# Patient Record
Sex: Female | Born: 2000 | Race: White | Hispanic: No | Marital: Single | Smoking: Never smoker
Health system: Southern US, Community
[De-identification: ages and names within clinical notes are randomized; demographics above are authoritative.]

---
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
## 2021-10-04 IMAGING — US US ABDOMEN LIMITED
1 series · 14 of 25 positions shown · non-contrast
Comparison: None.

CLINICAL DATA: Elevated LFTs

EXAM:
ULTRASOUND ABDOMEN LIMITED RIGHT UPPER QUADRANT

[Series 1: us abdomen limited ruq (liver/gb) · 14 of 42 slices shown]
[im 1/42]
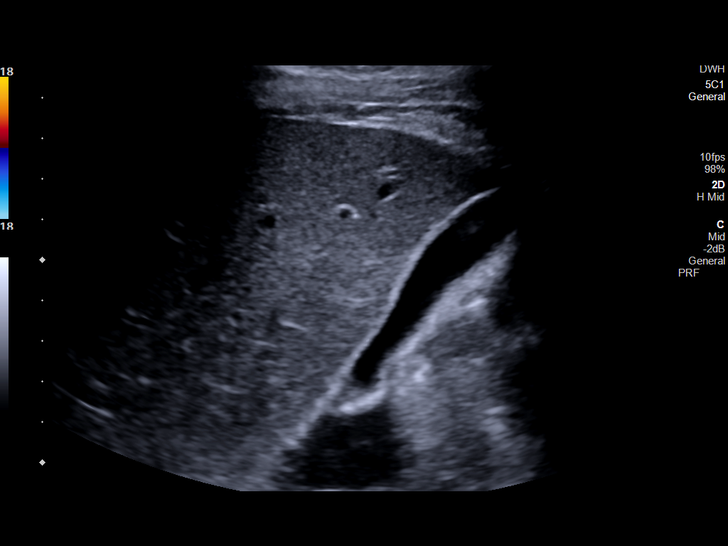
[im 4/42]
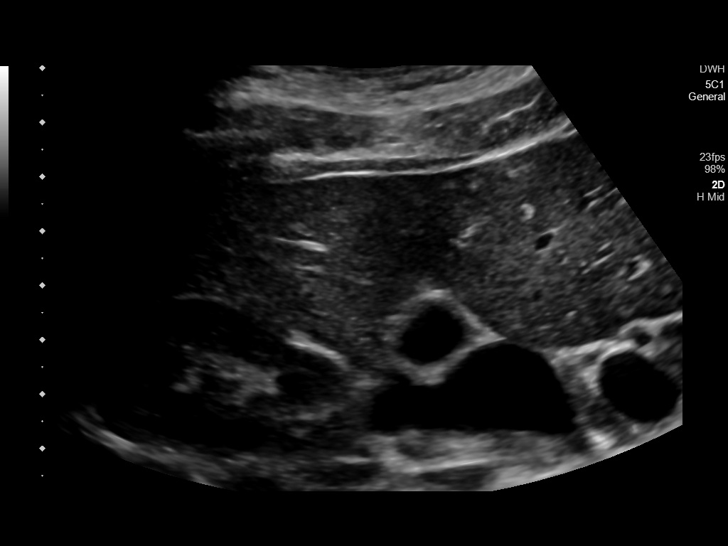
[im 7/42]
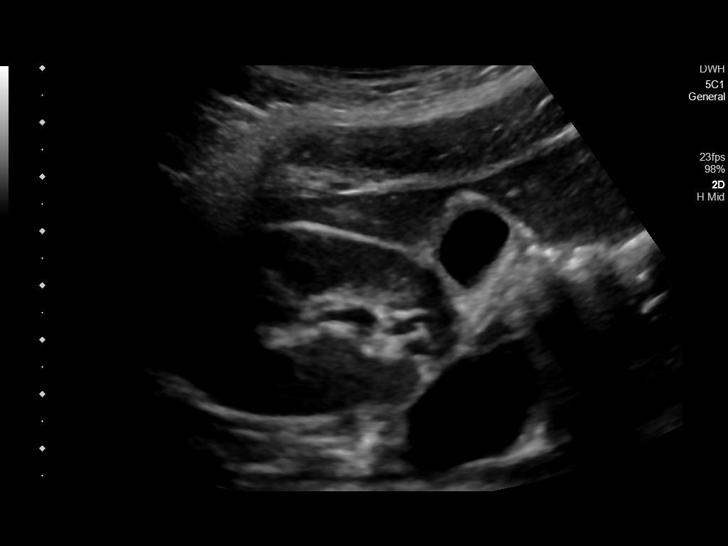
[im 11/42]
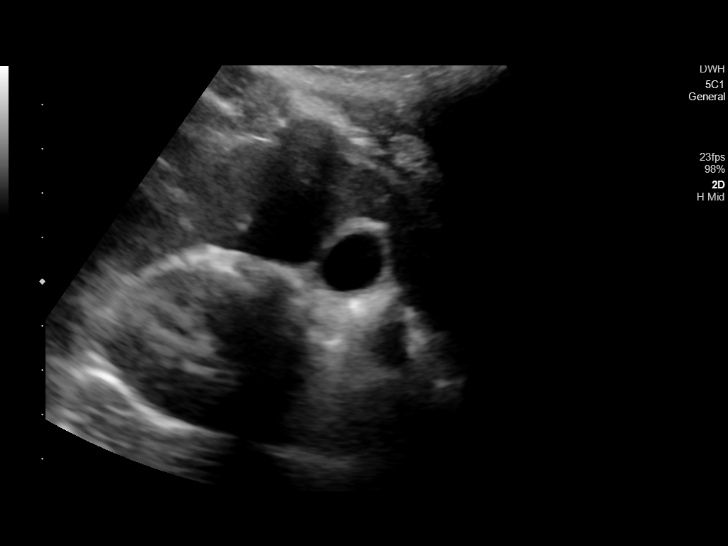
[im 14/42]
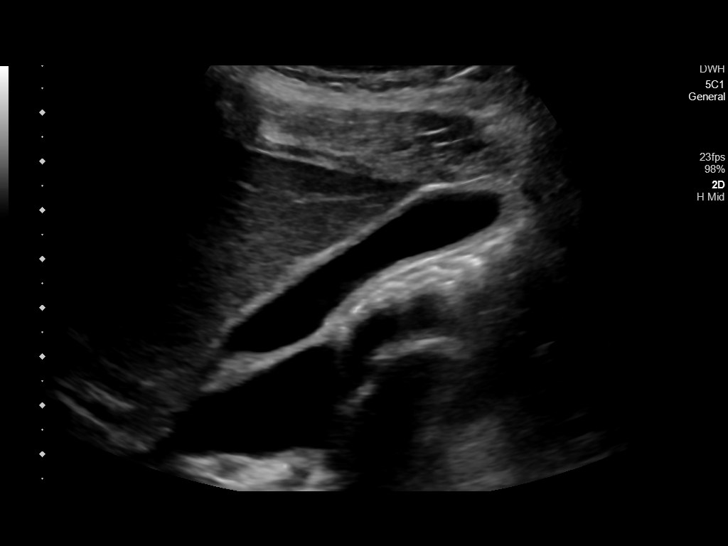
[im 16/42]
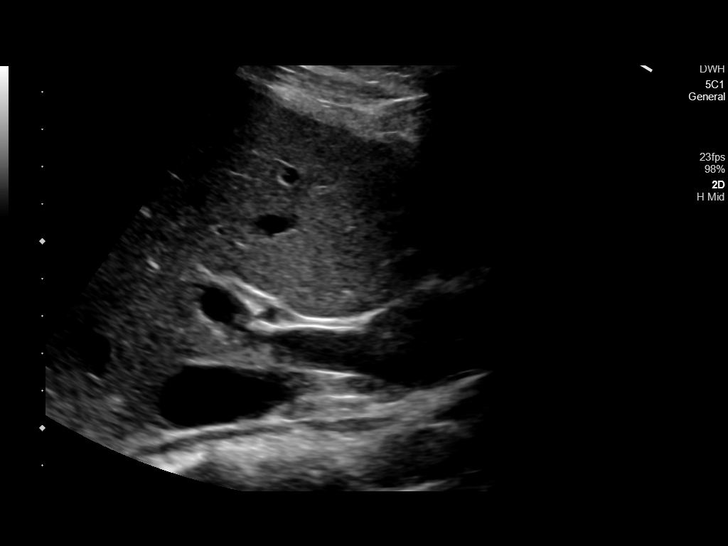
[im 19/42]
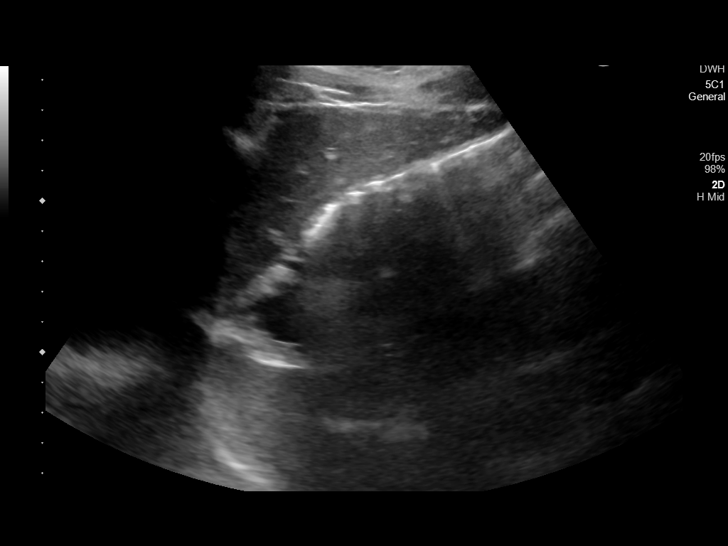
[im 23/42]
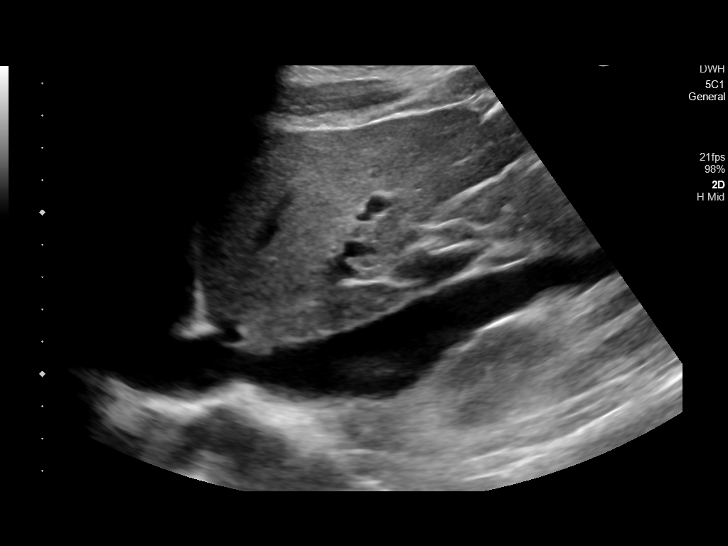
[im 26/42]
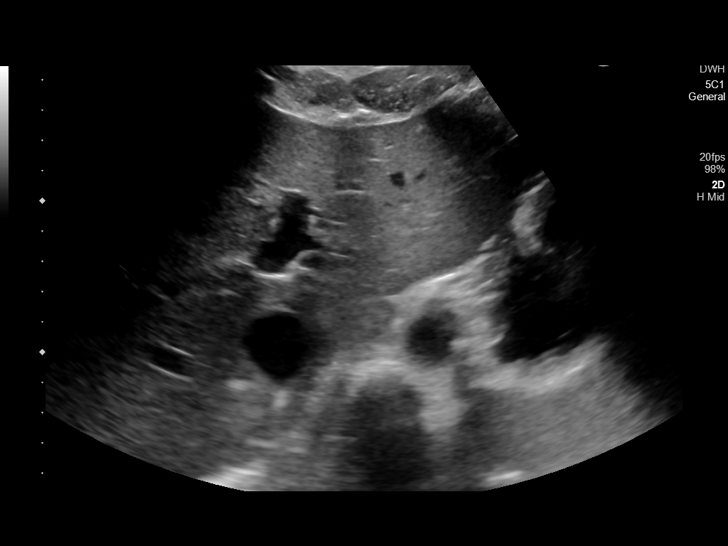
[im 28/42]
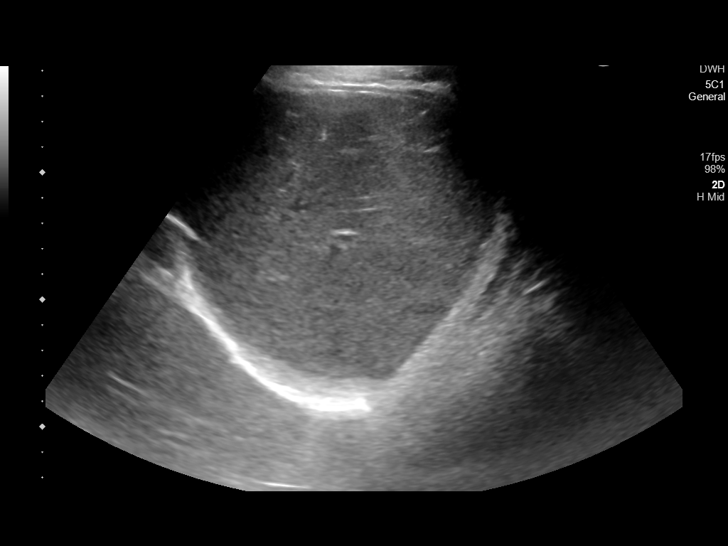
[im 31/42]
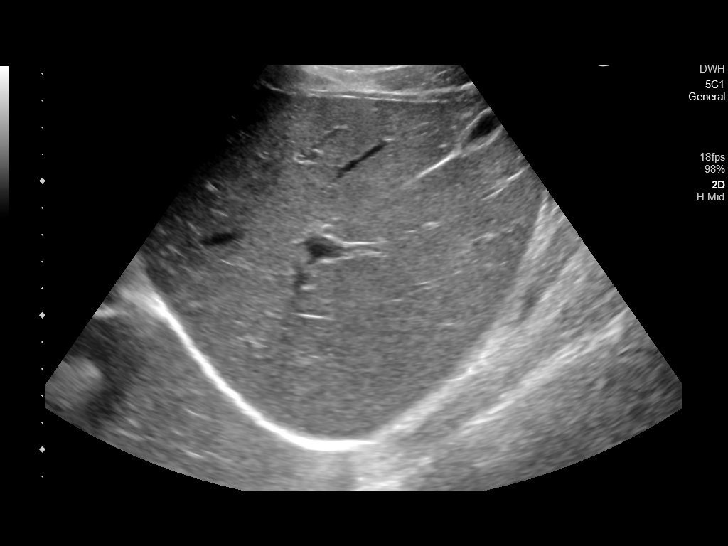
[im 35/42]
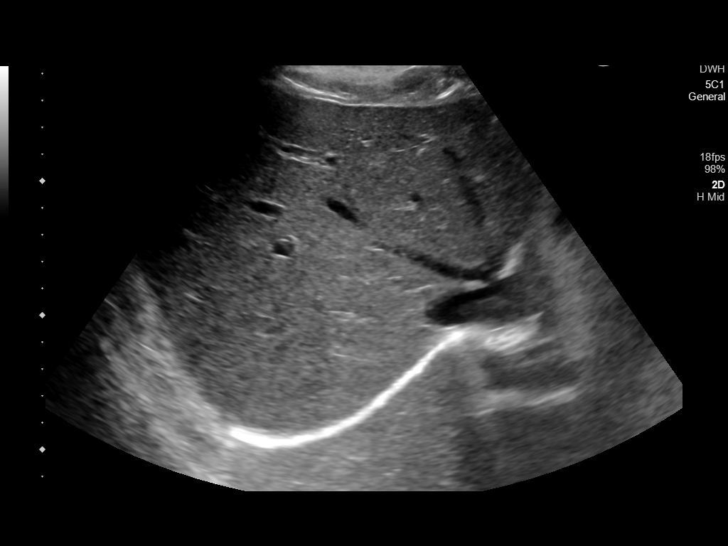
[im 38/42]
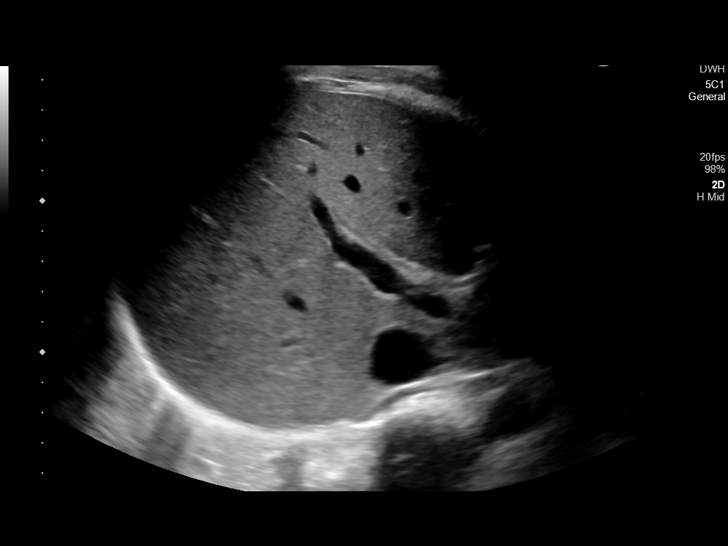
[im 42/42]
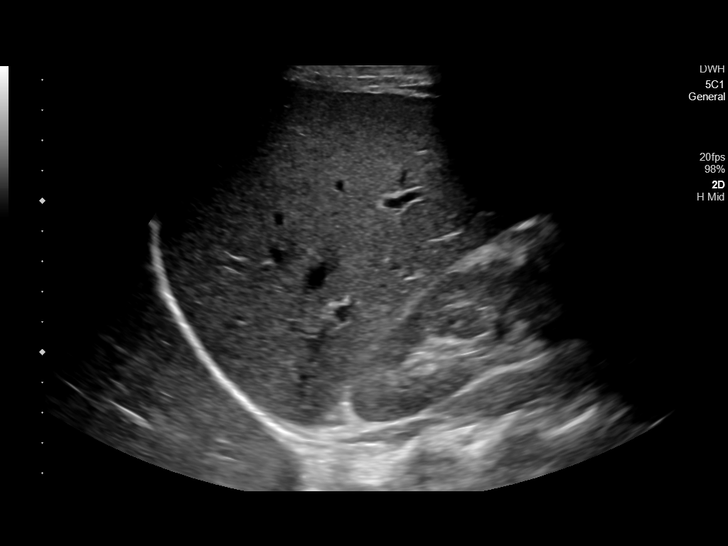

[14 of 25 positions shown; findings below may reference images not displayed]

FINDINGS: Gallbladder:

No gallstones or wall thickening visualized. No sonographic Murphy
sign noted by sonographer.

Common bile duct:

Diameter: 2.3 mm, nondilated

Liver:

No focal lesion identified. Within normal limits in parenchymal
echogenicity. Portal vein is patent on color Doppler imaging with
normal direction of blood flow towards the liver.

Other: None.
IMPRESSION: Unremarkable right upper quadrant ultrasound.
